# Patient Record
Sex: Male | Born: 1965 | Race: Black or African American | Hispanic: No | Marital: Single | Smoking: Never smoker
Health system: Southern US, Community
[De-identification: ages and names within clinical notes are randomized; demographics above are authoritative.]

## PROBLEM LIST (undated history)

## (undated) DIAGNOSIS — F1010 Alcohol abuse, uncomplicated: Secondary | ICD-10-CM

## (undated) HISTORY — DX: Alcohol abuse, uncomplicated: F10.10

---

## 2005-02-26 ENCOUNTER — Emergency Department: Payer: Self-pay | Admitting: Emergency Medicine

## 2019-01-24 ENCOUNTER — Emergency Department: Payer: No Typology Code available for payment source

## 2019-01-24 ENCOUNTER — Encounter: Payer: Self-pay | Admitting: Emergency Medicine

## 2019-01-24 ENCOUNTER — Other Ambulatory Visit: Payer: Self-pay

## 2019-01-24 ENCOUNTER — Emergency Department
Admission: EM | Admit: 2019-01-24 | Discharge: 2019-01-24 | Disposition: A | Discharge status: Home / Self Care | Payer: No Typology Code available for payment source | Attending: Emergency Medicine | Admitting: Emergency Medicine

## 2019-01-24 DIAGNOSIS — S86912A Strain of unspecified muscle(s) and tendon(s) at lower leg level, left leg, initial encounter: Secondary | ICD-10-CM

## 2019-01-24 DIAGNOSIS — Y999 Unspecified external cause status: Secondary | ICD-10-CM | POA: Insufficient documentation

## 2019-01-24 DIAGNOSIS — Y92414 Local residential or business street as the place of occurrence of the external cause: Secondary | ICD-10-CM | POA: Diagnosis not present

## 2019-01-24 DIAGNOSIS — Y93I9 Activity, other involving external motion: Secondary | ICD-10-CM | POA: Diagnosis not present

## 2019-01-24 DIAGNOSIS — S161XXA Strain of muscle, fascia and tendon at neck level, initial encounter: Secondary | ICD-10-CM | POA: Diagnosis not present

## 2019-01-24 DIAGNOSIS — S199XXA Unspecified injury of neck, initial encounter: Secondary | ICD-10-CM | POA: Diagnosis present

## 2019-01-24 DIAGNOSIS — S86812A Strain of other muscle(s) and tendon(s) at lower leg level, left leg, initial encounter: Secondary | ICD-10-CM | POA: Diagnosis not present

## 2019-01-24 MED ORDER — TRAMADOL HCL 50 MG PO TABS: 50.0000 mg | ORAL_TABLET | Freq: Four times a day (QID) | ORAL | 0 refills | Status: AC | PRN

## 2019-01-24 MED ORDER — METHOCARBAMOL 500 MG PO TABS: 500.0000 mg | ORAL_TABLET | Freq: Four times a day (QID) | ORAL | 1 refills | Status: AC

## 2019-01-24 MED ORDER — METHOCARBAMOL 500 MG PO TABS
500.0000 mg | ORAL_TABLET | Freq: Once | ORAL | Status: AC
  Administered 2019-01-24: 500 mg via ORAL
  Filled 2019-01-24: qty 1

## 2019-01-24 MED ORDER — TRAMADOL HCL 50 MG PO TABS
50.0000 mg | ORAL_TABLET | Freq: Once | ORAL | Status: AC
  Administered 2019-01-24: 50 mg via ORAL
  Filled 2019-01-24: qty 1
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
## 2019-01-24 NOTE — Discharge Instructions (Signed)
Please take medications as prescribed.  Apply ice to the neck and to the left leg 20 minutes every hour as needed for the next couple of days.  After 2 days you may transition over to heat.  Return to the ER for any worsening symptoms or changes in health.
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
## 2019-01-24 NOTE — ED Notes (Signed)
Esign pad not working. Pt received DC instructions and verbalized understanding;
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
## 2019-04-23 ENCOUNTER — Emergency Department: Payer: Self-pay

## 2019-04-23 ENCOUNTER — Emergency Department
Admission: EM | Admit: 2019-04-23 | Discharge: 2019-04-23 | Disposition: A | Discharge status: Home / Self Care | Payer: Self-pay | Attending: Student | Admitting: Student

## 2019-04-23 ENCOUNTER — Other Ambulatory Visit: Payer: Self-pay

## 2019-04-23 ENCOUNTER — Encounter: Payer: Self-pay | Admitting: *Deleted

## 2019-04-23 DIAGNOSIS — R079 Chest pain, unspecified: Secondary | ICD-10-CM | POA: Insufficient documentation

## 2019-04-23 LAB — BASIC METABOLIC PANEL
Anion gap: 10 (ref 5–15)
BUN: 8 mg/dL (ref 6–20)
CO2: 25 mmol/L (ref 22–32)
Calcium: 8.1 mg/dL — ABNORMAL LOW (ref 8.9–10.3)
Chloride: 100 mmol/L (ref 98–111)
Creatinine, Ser: 1.05 mg/dL (ref 0.61–1.24)
GFR calc Af Amer: >60 mL/min (ref >60)
GFR calc non Af Amer: >60 mL/min (ref >60)
Glucose, Bld: 112 mg/dL — ABNORMAL HIGH (ref 70–99)
Potassium: 3.5 mmol/L (ref 3.5–5.1)
Sodium: 135 mmol/L (ref 135–145)

## 2019-04-23 LAB — CBC
HCT: 47.1 % (ref 39.0–52.0)
Hemoglobin: 16.4 g/dL (ref 13.0–17.0)
MCH: 29.9 pg (ref 26.0–34.0)
MCHC: 34.8 g/dL (ref 30.0–36.0)
MCV: 85.8 fL (ref 80.0–100.0)
Platelets: 215 10*3/uL (ref 150–400)
RBC: 5.49 MIL/uL (ref 4.22–5.81)
RDW: 14.5 % (ref 11.5–15.5)
WBC: 5.5 10*3/uL (ref 4.0–10.5)
nRBC: 0 % (ref 0.0–0.2)

## 2019-04-23 LAB — TROPONIN I (HIGH SENSITIVITY)
Troponin I (High Sensitivity): 8 ng/L (ref <18)
Troponin I (High Sensitivity): 7 ng/L (ref <18)

## 2019-04-23 MED ORDER — ASPIRIN 81 MG PO CHEW
324.0000 mg | CHEWABLE_TABLET | Freq: Once | ORAL | Status: AC
  Administered 2019-04-23: 324 mg via ORAL
  Filled 2019-04-23: qty 4

## 2019-04-23 MED ORDER — FENTANYL CITRATE (PF) 100 MCG/2ML IJ SOLN
50.0000 ug | Freq: Once | INTRAMUSCULAR | Status: AC
  Administered 2019-04-23: 23:00:00 50 ug via INTRAVENOUS
  Filled 2019-04-23: qty 2

## 2019-04-23 NOTE — ED Notes (Signed)
Patient resting quietly in room with no apparent acute distress.  He is requesting pain medication for chest pain 7/10.  He is expressing no other needs at this time.  Toileting offered, bed in lowest position, call light within reach.  Will continue to monitor.

## 2019-04-23 NOTE — ED Provider Notes (Signed)
[facility] Emergency Department Provider Note  ____________________________________________   First MD Initiated Contact with Patient 04/23/19 2121     (approximate)  I have reviewed the triage vital signs and the nursing notes.  History  Chief Complaint Chest Pain    HPI Zachary Kemp is a 54 y.o. male with no significant medical history who presents emergency department for chest discomfort.  Patient reports central chest discomfort, radiating to his left arm.  Symptoms have been constant for 2 days.  He describes it as an aching type sensation, 7/10 in severity.  No alleviating or aggravating components.  No associated nausea, diaphoresis, shortness of breath.  Does not smoke.  No personal history of CAD.  He initially felt his symptoms were due to reflux, and has been taking over-the-counter GERD medications with no significant improvement.   Past Medical Hx No past medical history on file.  Problem List There are no problems to display for this patient.   Past Surgical Hx No past surgical history on file.  Medications Prior to Admission medications   Medication Sig Start Date End Date Taking? Authorizing Provider  methocarbamol (ROBAXIN) 500 MG tablet Take 1 tablet (500 mg total) by mouth 4 (four) times daily. 01/24/19   Evon Slack, PA-C  traMADol (ULTRAM) 50 MG tablet Take 1 tablet (50 mg total) by mouth every 6 (six) hours as needed. 01/24/19 01/24/20  Evon Slack, PA-C    Allergies Patient has no known allergies.  Family Hx No family history on file.  Social Hx Social History   Tobacco Use  . Smoking status: Never Smoker  . Smokeless tobacco: Never Used  Substance Use Topics  . Alcohol use: Yes  . Drug use: Never     Review of Systems  Constitutional: Negative for fever, chills. Eyes: Negative for visual changes. ENT: Negative for sore throat. Cardiovascular: + for chest pain. Respiratory: Negative for  shortness of breath. Gastrointestinal: Negative for nausea, vomiting.  Genitourinary: Negative for dysuria. Musculoskeletal: Negative for leg swelling. Skin: Negative for rash. Neurological: Negative for for headaches.   Physical Exam  Vital Signs: ED Triage Vitals  Enc Vitals Group     BP 04/23/19 2117 (!) 143/89     Pulse Rate 04/23/19 1945 83     Resp 04/23/19 1945 18     Temp 04/23/19 1945 99.7 F (37.6 C)     Temp Source 04/23/19 1945 Oral     SpO2 04/23/19 1945 95 %     Weight 04/23/19 1943 161 lb (73 kg)     Height 04/23/19 1943 5\' 4"  (1.626 m)     Head Circumference --      Peak Flow --      Pain Score 04/23/19 1942 7     Pain Loc --      Pain Edu? --      Excl. in GC? --     Constitutional: Alert and oriented.  Head: Normocephalic. Atraumatic. Eyes: Conjunctivae clear. Sclera anicteric. Nose: No congestion. No rhinorrhea. Mouth/Throat: Wearing mask.  Neck: No stridor.   Cardiovascular: Normal rate, regular rhythm. Extremities well perfused. Respiratory: Normal respiratory effort.  Lungs CTAB. Gastrointestinal: Soft. Non-tender. Non-distended.  Musculoskeletal: No lower extremity edema. No deformities. Neurologic:  Normal speech and language. No gross focal neurologic deficits are appreciated.  Skin: Skin is warm, dry and intact. No rash noted. Psychiatric: Mood and affect are appropriate for situation.  EKG  Personally reviewed.   Rate: 85 Rhythm: sinus  Axis: normal Intervals: WNL No acute ischemic changes No STEMI    Radiology  CXR: IMPRESSION:  No active cardiopulmonary disease.    Procedures  Procedure(s) performed (including critical care):  Procedures   Initial Impression / Assessment and Plan / ED Course  54 y.o. male who presents to the ED for chest discomfort, as above.  Ddx: ACS, GERD, MSK  Will obtain labs, EKG, imaging, ASA, pain control and reassess  EKG without acute ischemic changes, troponin x 2 negative, XR  negative.  Patient reports improvement in pain.  Given negative work-up, patient stable for discharge.  Given information for cardiology for outpatient follow-up.  Patient voices understanding and is comfortable to plan and discharge.   Final Clinical Impression(s) / ED Diagnosis  Final diagnoses:  Chest pain in adult       Note:  This document was prepared using Dragon voice recognition software and may include unintentional dictation errors.   Lilia Pro., MD 04/23/19 [phone]

## 2019-04-23 NOTE — ED Triage Notes (Signed)
Pt reports chest discomfort for 2 days.  No sob.  Nonsmoker.  Pt reports radiating pain in left arm   No n/v/  No diaphoresis.  Pt alert  Speech clear.

## 2019-04-23 NOTE — Discharge Instructions (Signed)
Thank you for letting us take care of you in the emergency department today.   Please continue to take any regular, prescribed medications.   Please follow up with: - A cardiology doctor to follow up on your ER visit, information for two are below  Please return to the ER for any new or worsening symptoms.

## 2019-08-04 ENCOUNTER — Ambulatory Visit: Payer: Self-pay | Attending: Internal Medicine

## 2019-08-04 DIAGNOSIS — Z23 Encounter for immunization: Secondary | ICD-10-CM

## 2019-08-04 NOTE — Progress Notes (Signed)
   Covid-19 Vaccination Clinic  Name:  Zachary Kemp    MRN: [id number] DOB: Sep 01, 1965  08/04/2019  [name] was observed post Covid-19 immunization for 15 minutes without incident. He was provided with Vaccine Information Sheet and instruction to access the V-Safe system.   Mr. Rash was instructed to call 911 with any severe reactions post vaccine: Marland Kitchen Difficulty breathing  . Swelling of face and throat  . A fast heartbeat  . A bad rash all over body  . Dizziness and weakness   Immunizations Administered    Name Date Dose VIS Date Route   Pfizer COVID-19 Vaccine 08/04/2019 10:12 AM 0.3 mL 06/11/2018 Intramuscular   Manufacturer: [employer], [employer]   Lot: [id number]   NDC: [address]-3

## 2019-08-26 ENCOUNTER — Ambulatory Visit: Payer: Self-pay | Attending: Internal Medicine

## 2019-08-26 DIAGNOSIS — Z23 Encounter for immunization: Secondary | ICD-10-CM

## 2019-08-26 NOTE — Progress Notes (Signed)
   Covid-19 Vaccination Clinic  Name:  Zachary Kemp    MRN: [id number] DOB: [birth year]  08/26/2019  Mr. Zachary Kemp was observed post Covid-19 immunization for 15 minutes without incident. He was provided with Vaccine Information Sheet and instruction to access the V-Safe system.   Mr. Zachary Kemp was instructed to call 911 with any severe reactions post vaccine: Marland Kitchen Difficulty breathing  . Swelling of face and throat  . A fast heartbeat  . A bad rash all over body  . Dizziness and weakness   Immunizations Administered    Name Date Dose VIS Date Route   Pfizer COVID-19 Vaccine 08/26/2019 10:16 AM 0.3 mL 06/11/2018 Intramuscular   Manufacturer: [employer], [employer]   Lot: [id number]   NDC: [address]-8

## 2020-09-03 ENCOUNTER — Other Ambulatory Visit: Payer: Self-pay

## 2020-09-03 ENCOUNTER — Ambulatory Visit: Payer: Self-pay | Admitting: Physician Assistant

## 2020-09-03 ENCOUNTER — Encounter: Payer: Self-pay | Admitting: Physician Assistant

## 2020-09-03 DIAGNOSIS — Z113 Encounter for screening for infections with a predominantly sexual mode of transmission: Secondary | ICD-10-CM

## 2020-09-03 DIAGNOSIS — A5401 Gonococcal cystitis and urethritis, unspecified: Secondary | ICD-10-CM

## 2020-09-03 LAB — GRAM STAIN

## 2020-09-03 MED ORDER — DOXYCYCLINE HYCLATE 100 MG PO TABS: 100.0000 mg | ORAL_TABLET | Freq: Two times a day (BID) | ORAL | 0 refills | Status: AC

## 2020-09-03 MED ORDER — CEFTRIAXONE SODIUM 500 MG IJ SOLR
500.0000 mg | Freq: Once | INTRAMUSCULAR | Status: AC
  Administered 2020-09-03: 500 mg via INTRAMUSCULAR

## 2020-09-03 NOTE — Progress Notes (Signed)
Pt here for STD screening.  Gram stain results reviewed.  Medication dispensed and Ceftriaxone 500 mg IM given without any complications.

## 2020-09-03 NOTE — Progress Notes (Signed)
[facility] Department STI clinic/screening visit  Subjective:  Zachary Kemp is a 55 y.o. male being seen today for an STI screening visit. The patient reports they do have symptoms.    Patient has the following medical conditions:  There are no problems to display for this patient.    Chief Complaint  Patient presents with  . SEXUALLY TRANSMITTED DISEASE    screening    HPI  Patient reports that he has had a creamy discharge with dysuria for 2 days.  Denies other symptoms, chronic conditions, surgeries and regular medicines.  States last HIV test was about 2 years ago and last void prior to sample collection for Gram stain was 30 minutes ago.   See flowsheet for further details and programmatic requirements.    The following portions of the patient's history were reviewed and updated as appropriate: allergies, current medications, past medical history, past social history, past surgical history and problem list.  Objective:  There were no vitals filed for this visit.  Physical Exam Constitutional:      General: He is not in acute distress.    Appearance: Normal appearance.  HENT:     Head: Normocephalic and atraumatic.     Comments: No nits,lice, or hair loss. No cervical, supraclavicular or axillary adenopathy.    Mouth/Throat:     Mouth: Mucous membranes are moist.     Pharynx: Oropharynx is clear. No oropharyngeal exudate or posterior oropharyngeal erythema.  Eyes:     Conjunctiva/sclera: Conjunctivae normal.  Pulmonary:     Effort: Pulmonary effort is normal.  Abdominal:     Palpations: Abdomen is soft. There is no mass.     Tenderness: There is no abdominal tenderness. There is no guarding or rebound.  Genitourinary:    Penis: Normal.      Testes: Normal.     Comments: Pubic area without nits, lice, hair loss, edema, erythema, lesions and inguinal adenopathy. Penis circumcised without rash or lesions.  Moderate amount of brownish discharge  at meatus. Testicles descended bilaterally,nt, no masses or edema. Musculoskeletal:     Cervical back: Neck supple. No tenderness.  Skin:    General: Skin is warm and dry.     Findings: No bruising, erythema, lesion or rash.  Neurological:     Mental Status: He is alert and oriented to person, place, and time.  Psychiatric:        Mood and Affect: Mood normal.        Behavior: Behavior normal.        Thought Content: Thought content normal.        Judgment: Judgment normal.       Assessment and Plan:  Zachary Kemp is a 55 y.o. male presenting to the [facility] Department for STI screening  1. Screening for STD (sexually transmitted disease) Patient into clinic with symptoms. Rec condoms with all sex. Await test results.  Counseled that RN will call if needs to RTC for treatment once results are back. - Gram stain - HBV Antigen/Antibody [facility] Lab - HIV/HCV [place] [facility] - Syphilis Serology,  Lab  2. Gonococcal urethritis in male Treat for GC and cover for Chlamydia with Ceftriaxone 500 mg IM and Doxycycline 100 mg #14 1 po BID for 7 days. No sex for 14 days and until after partner completes treatment. Call with questions or concerns. - cefTRIAXone (ROCEPHIN) injection 500 mg - doxycycline (VIBRA-TABS) 100 MG tablet; Take 1 tablet (100 mg total)  by mouth 2 (two) times daily for 7 days.  Dispense: 14 tablet; Refill: 0     No follow-ups on file.  No future appointments.  Matt Holmes, PA

## 2020-09-08 LAB — HM HEPATITIS C SCREENING LAB: HM Hepatitis Screen: NEGATIVE

## 2020-09-08 LAB — HM HIV SCREENING LAB: HM HIV Screening: NEGATIVE

## 2020-09-10 LAB — HEPATITIS B SURFACE ANTIGEN: Hepatitis B Surface Ag: NONREACTIVE

## 2021-05-02 IMAGING — CR DG CHEST 2V
1 series · 2 of 2 positions shown · non-contrast
Comparison: 09/26/2014

CLINICAL DATA: Chest pain for 2 days

EXAM:
CHEST - 2 VIEW

[Series 1: dg chest 2 view · 0.14mm/px · 2 of 2 slices shown]
[im 1/2]
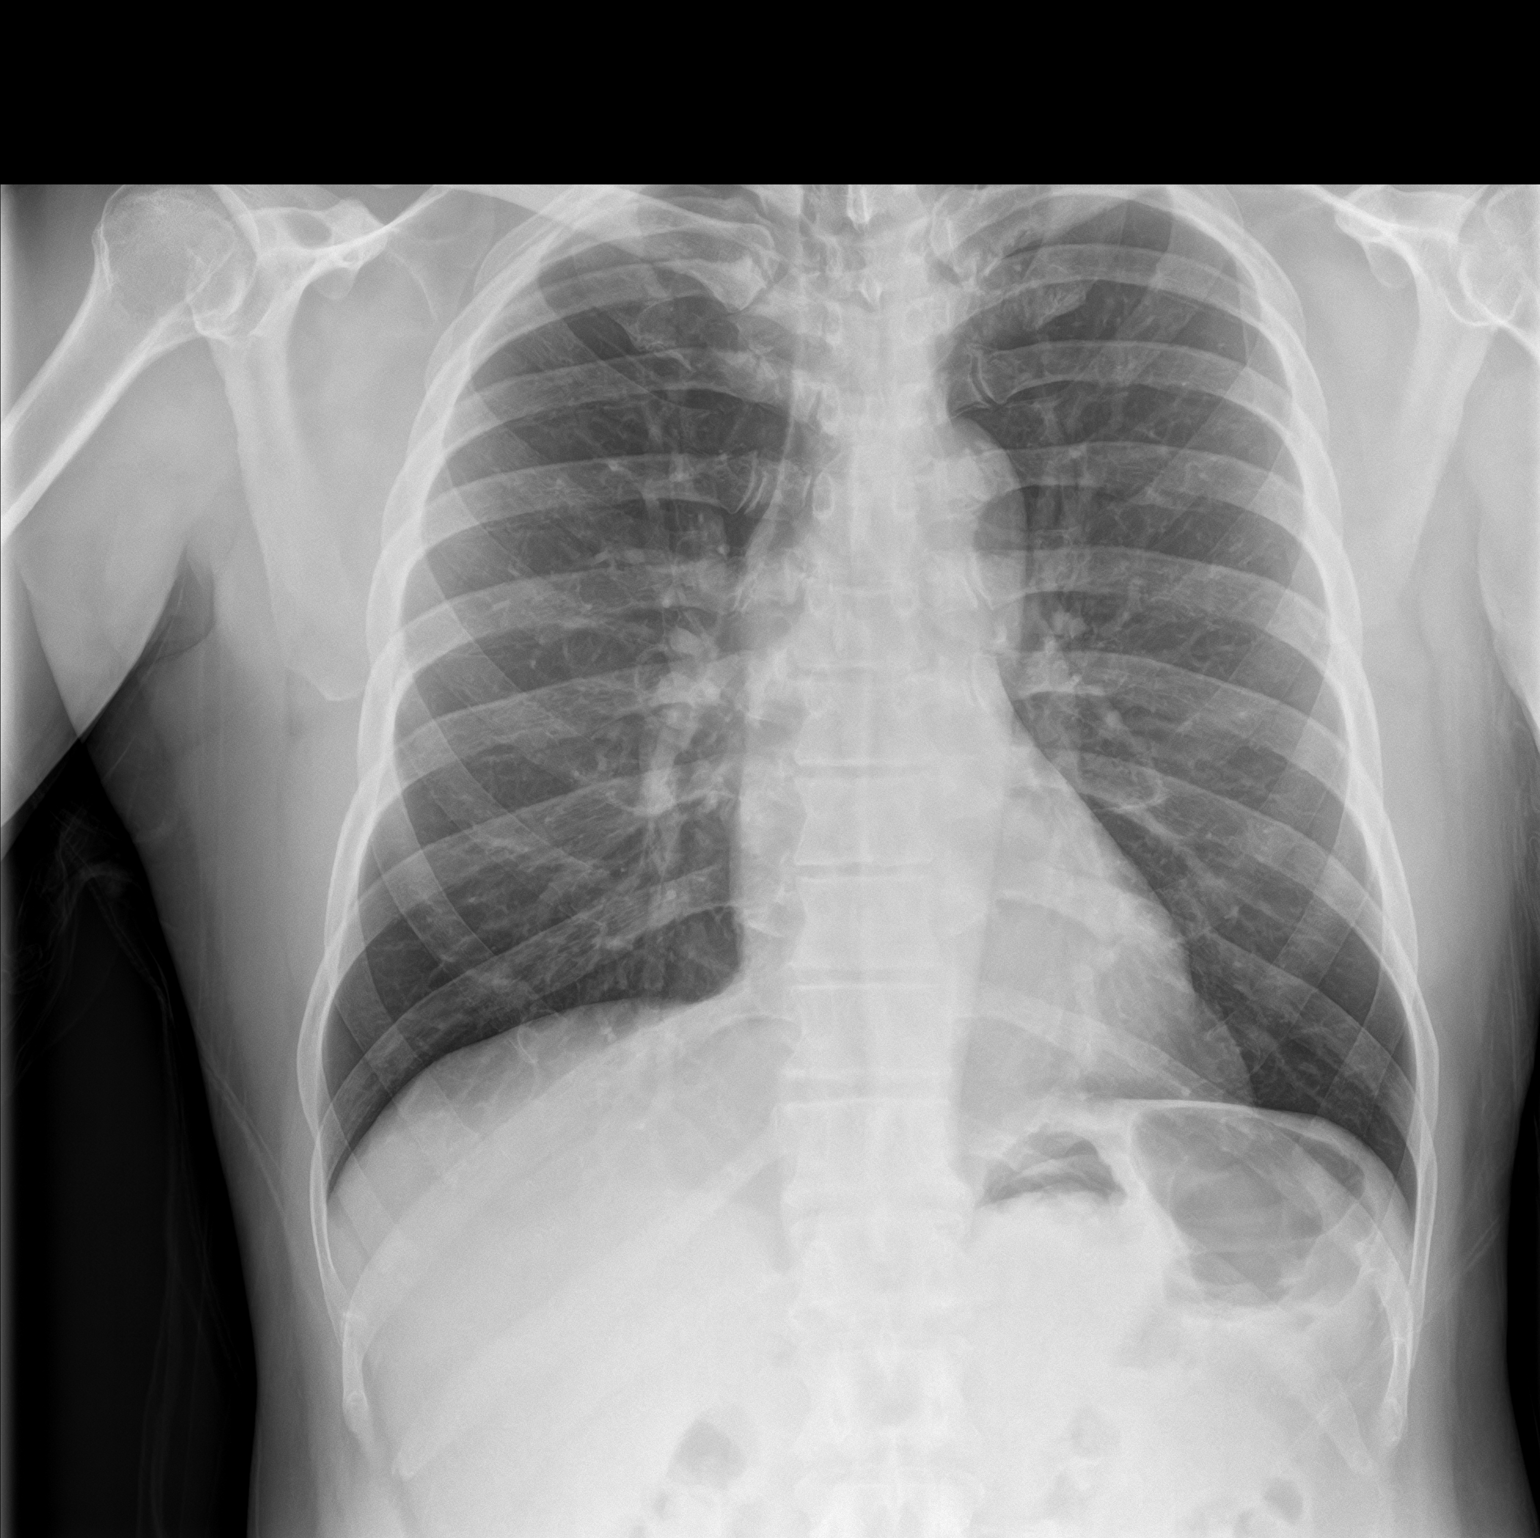
[im 2/2]
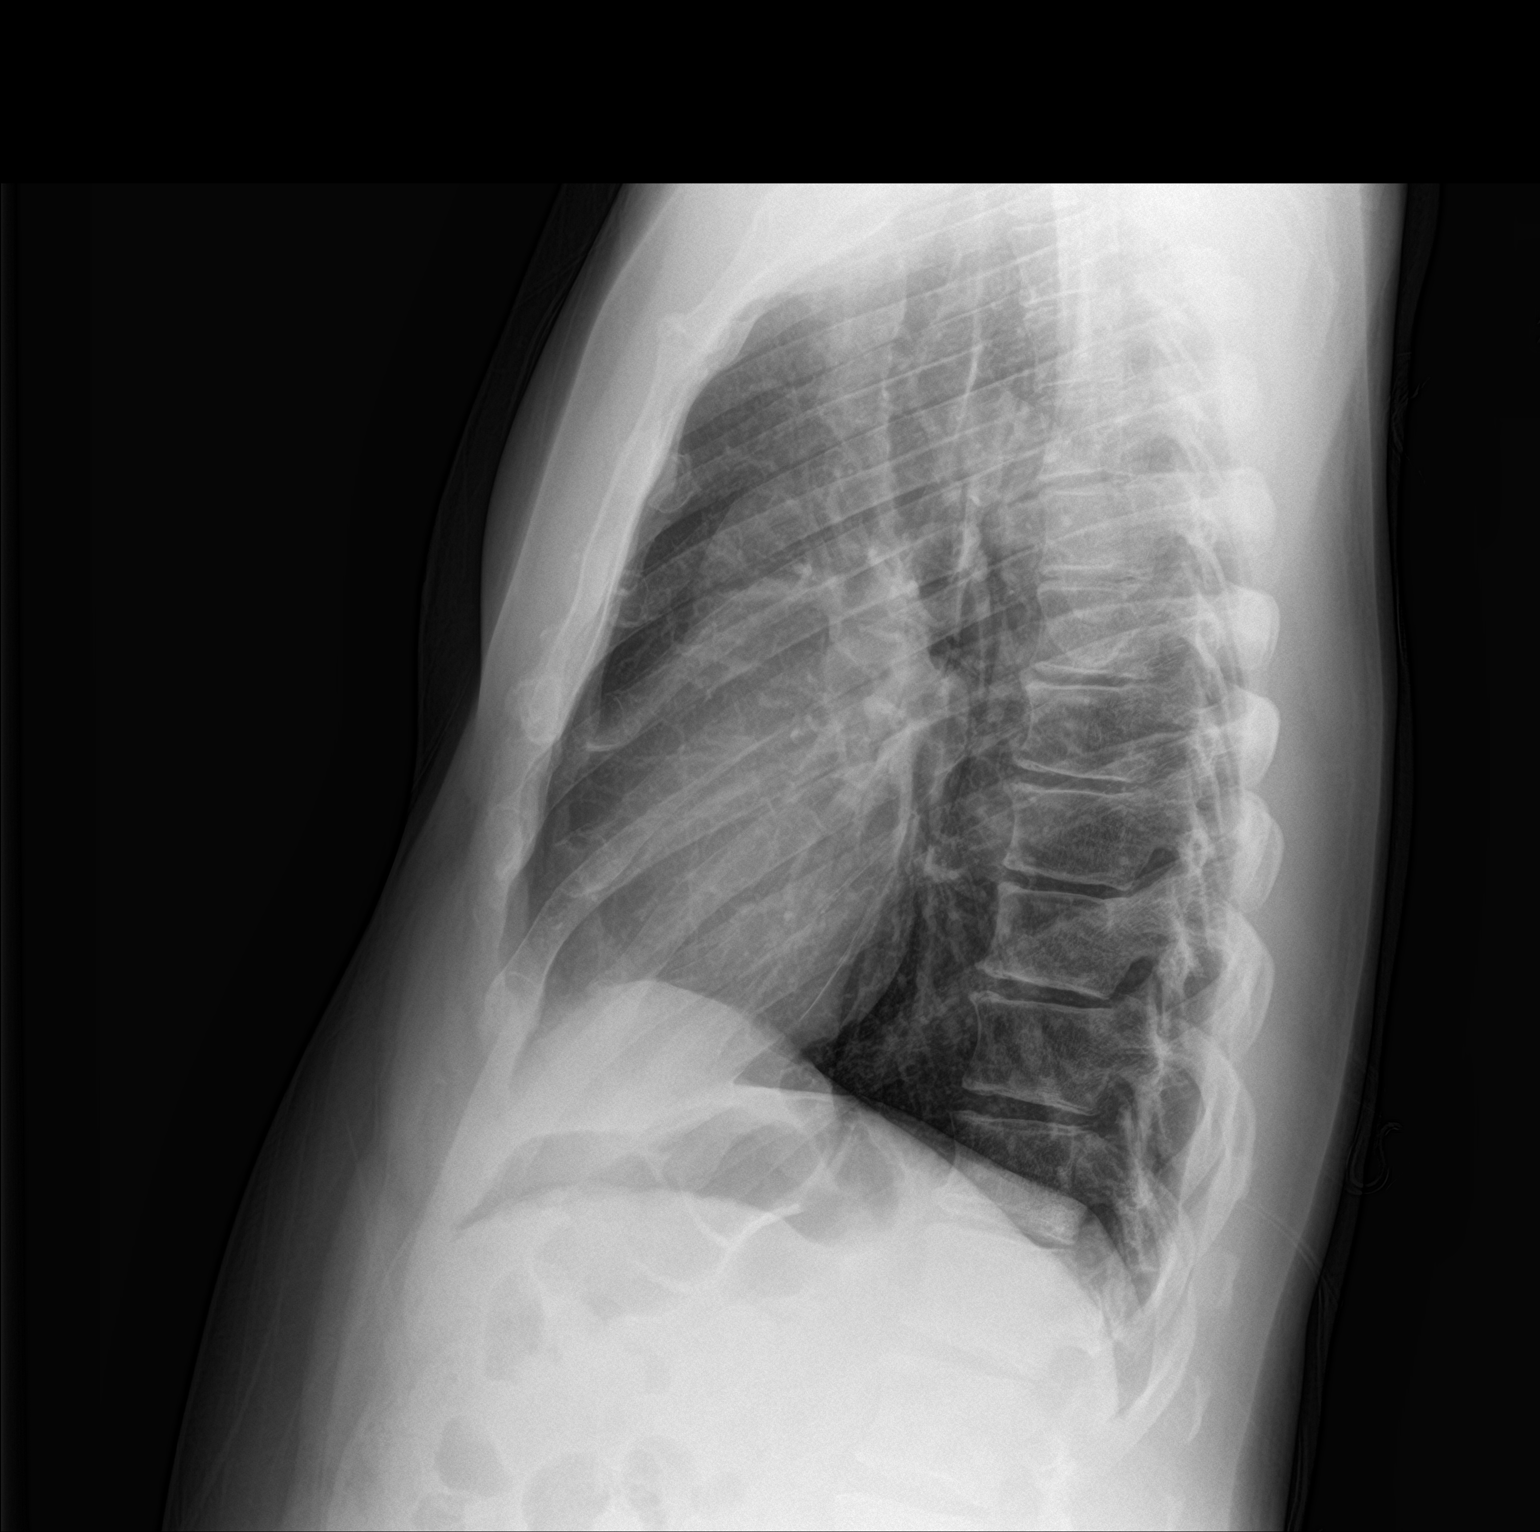

[2 of 2 positions shown; findings below may reference images not displayed]

FINDINGS: Cardiac shadows within normal limits. The lungs are well aerated
bilaterally. No focal infiltrate or sizable effusion is seen. No
bony abnormality is noted.
IMPRESSION: No active cardiopulmonary disease.

## 2021-10-05 NOTE — Congregational Nurse Program (Signed)
  Dept: [phone]   Congregational Nurse Program Note  Date of Encounter: 10/05/2021  Past Medical History: No past medical history on file.  Encounter Details:  CNP Questionnaire - 10/05/21 1315       Questionnaire   Do you give verbal consent to treat you today? Yes    Location Patient Served  Not Applicable    Visit Setting Church or Organization    Patient Status Unknown    Insurance Uninsured ([employer]/Self-Pay)    Production designer, theatre/television/film Assistance    Medication N/A    Medical Provider No    Screening Referrals Annual Wellness Visit    Medical Referral [facility] PCP/Clinic;Dental    Medical Appointment Made N/A    Food Have Food Insecurities    Transportation Need transportation assistance    Housing/Utilities No permanent housing    Interpersonal Safety N/A    Intervention Navigate Healthcare System;Counsel    ED Visit Averted N/A    Life-Saving Intervention Made N/A            client into nurse only clinic at [facility] requesting assistance with access to PCP and dentist. Initial Visit. HIPAA discussed. Co re [facility] and [facility] dental [facility]; completed and submitted application. Co re importance of routine medical care. Declines screenings today. States no medical care in 4 years. Non smoker, drinks a 6-pack / day. Does not take any meds. States he stays with mom for now; but will need housing in ~2 weeks. No income. His mom provides essentials. Best phone number is mother's at [phone]. Okay to leave a message with his mom. follow up with [facility]. RTC prn. Rhermann, RN

## 2022-08-26 ENCOUNTER — Emergency Department
Admission: EM | Admit: 2022-08-26 | Discharge: 2022-08-26 | Disposition: A | Discharge status: Home / Self Care | Payer: 59 | Attending: Emergency Medicine | Admitting: Emergency Medicine

## 2022-08-26 ENCOUNTER — Other Ambulatory Visit: Payer: Self-pay

## 2022-08-26 DIAGNOSIS — K2971 Gastritis, unspecified, with bleeding: Secondary | ICD-10-CM | POA: Diagnosis not present

## 2022-08-26 DIAGNOSIS — K2970 Gastritis, unspecified, without bleeding: Secondary | ICD-10-CM

## 2022-08-26 DIAGNOSIS — R231 Pallor: Secondary | ICD-10-CM | POA: Diagnosis not present

## 2022-08-26 DIAGNOSIS — R1030 Lower abdominal pain, unspecified: Secondary | ICD-10-CM | POA: Diagnosis not present

## 2022-08-26 DIAGNOSIS — I959 Hypotension, unspecified: Secondary | ICD-10-CM | POA: Diagnosis not present

## 2022-08-26 DIAGNOSIS — R109 Unspecified abdominal pain: Secondary | ICD-10-CM | POA: Diagnosis not present

## 2022-08-26 LAB — CBC
HCT: 48.8 % (ref 39.0–52.0)
Hemoglobin: 16.3 g/dL (ref 13.0–17.0)
MCH: 31 pg (ref 26.0–34.0)
MCHC: 33.4 g/dL (ref 30.0–36.0)
MCV: 92.8 fL (ref 80.0–100.0)
Platelets: 154 10*3/uL (ref 150–400)
RBC: 5.26 MIL/uL (ref 4.22–5.81)
RDW: 13.9 % (ref 11.5–15.5)
WBC: 5.5 10*3/uL (ref 4.0–10.5)
nRBC: 0 % (ref 0.0–0.2)

## 2022-08-26 LAB — URINALYSIS, ROUTINE W REFLEX MICROSCOPIC
Bacteria, UA: NONE SEEN
Bilirubin Urine: NEGATIVE
Glucose, UA: NEGATIVE mg/dL
Hgb urine dipstick: NEGATIVE
Leukocytes,Ua: NEGATIVE
Nitrite: NEGATIVE
Protein, ur: NEGATIVE mg/dL
Specific Gravity, Urine: 1.015 (ref 1.005–1.030)
Squamous Epithelial / HPF: NONE SEEN /HPF (ref 0–5)
pH: 5.5 (ref 5.0–8.0)

## 2022-08-26 LAB — COMPREHENSIVE METABOLIC PANEL
ALT: 50 U/L — ABNORMAL HIGH (ref 0–44)
AST: 79 U/L — ABNORMAL HIGH (ref 15–41)
Albumin: 4.1 g/dL (ref 3.5–5.0)
Alkaline Phosphatase: 26 U/L — ABNORMAL LOW (ref 38–126)
Anion gap: 5 (ref 5–15)
BUN: 7 mg/dL (ref 6–20)
CO2: 25 mmol/L (ref 22–32)
Calcium: 7.7 mg/dL — ABNORMAL LOW (ref 8.9–10.3)
Chloride: 103 mmol/L (ref 98–111)
Creatinine, Ser: 0.9 mg/dL (ref 0.61–1.24)
GFR, Estimated: >60 mL/min (ref >60)
Glucose, Bld: 123 mg/dL — ABNORMAL HIGH (ref 70–99)
Potassium: 4.2 mmol/L (ref 3.5–5.1)
Sodium: 133 mmol/L — ABNORMAL LOW (ref 135–145)
Total Bilirubin: 0.9 mg/dL (ref 0.3–1.2)
Total Protein: 6.3 g/dL — ABNORMAL LOW (ref 6.5–8.1)

## 2022-08-26 LAB — LIPASE, BLOOD: Lipase: 41 U/L (ref 11–51)

## 2022-08-26 MED ORDER — ALUM & MAG HYDROXIDE-SIMETH 200-200-20 MG/5ML PO SUSP
30.0000 mL | Freq: Once | ORAL | Status: AC
  Administered 2022-08-26: 30 mL via ORAL
  Filled 2022-08-26: qty 30

## 2022-08-26 MED ORDER — FAMOTIDINE 20 MG PO TABS: 20.0000 mg | ORAL_TABLET | Freq: Every day | ORAL | 1 refills | Status: AC

## 2022-08-26 MED ORDER — SUCRALFATE 1 G PO TABS: 1.0000 g | ORAL_TABLET | Freq: Four times a day (QID) | ORAL | 0 refills | Status: AC

## 2022-08-26 MED ORDER — LIDOCAINE VISCOUS HCL 2 % MT SOLN
15.0000 mL | Freq: Once | OROMUCOSAL | Status: AC
  Administered 2022-08-26: 15 mL via ORAL
  Filled 2022-08-26: qty 15

## 2022-08-26 NOTE — Discharge Instructions (Signed)
Please seek medical attention for any high fevers, chest pain, shortness of breath, change in behavior, persistent vomiting, bloody stool or any other new or concerning symptoms.  

## 2022-08-26 NOTE — ED Provider Notes (Signed)
[facility] Provider Note    Event Date/Time   First MD Initiated Contact with Patient 08/26/22 2137     (approximate)   History   Abdominal Pain   HPI  Zachary Kemp is a 57 y.o. male  who presents to the emergency department today because of concern for abdominal pain. The patient states that the pain started today after he drank a beer. Located in the periumbilical region. Initially 10/10 however at the time of my exam 5/10. The patient did not have any nausea or vomiting. Denies any recent change in stool. Does use alcohol daily. Patient also gave plasma prior to imbibing but has done that for a long time and has not had the abdominal pain before.      Physical Exam   Triage Vital Signs: ED Triage Vitals  Enc Vitals Group     BP 08/26/22 1709 (!) 135/90     Pulse Rate 08/26/22 1709 72     Resp 08/26/22 1709 17     Temp 08/26/22 1709 98.3 F (36.8 C)     Temp Source 08/26/22 1709 Oral     SpO2 08/26/22 1709 95 %     Weight 08/26/22 1715 157 lb (71.2 kg)     Height 08/26/22 1715 5\' 4"  (1.626 m)     Head Circumference --      Peak Flow --      Pain Score 08/26/22 1714 5     Pain Loc --      Pain Edu? --      Excl. in GC? --     Most recent vital signs: Vitals:   08/26/22 1709  BP: (!) 135/90  Pulse: 72  Resp: 17  Temp: 98.3 F (36.8 C)  SpO2: 95%   General: Awake, alert, oriented. CV:  Good peripheral perfusion. Regular rate and rhythm. Resp:  Normal effort. Lungs clear. Abd:  No distention. Tender to palpation in the epigastric region.   ED Results / Procedures / Treatments   Labs (all labs ordered are listed, but only abnormal results are displayed) Labs Reviewed  URINALYSIS, ROUTINE W REFLEX MICROSCOPIC - Abnormal; Notable for the following components:      Result Value   Color, Urine YELLOW (*)    Ketones, ur TRACE (*)    All other components within normal limits  COMPREHENSIVE METABOLIC PANEL - Abnormal; Notable  for the following components:   Sodium 133 (*)    Glucose, Bld 123 (*)    Calcium 7.7 (*)    Total Protein 6.3 (*)    AST 79 (*)    ALT 50 (*)    Alkaline Phosphatase 26 (*)    All other components within normal limits  CBC  LIPASE, BLOOD     EKG  None   RADIOLOGY None   PROCEDURES:  Critical Care performed: No    MEDICATIONS ORDERED IN ED: Medications - No data to display   IMPRESSION / MDM / ASSESSMENT AND PLAN / ED COURSE  I reviewed the triage vital signs and the nursing notes.                              Differential diagnosis includes, but is not limited to, pancreatitis, gastritis, ulcer, liver disease, cholecystitis.  Patient's presentation is most consistent with acute presentation with potential threat to life or bodily function.  Patient presented to the emergency department today because of  concerns for abdominal pain.  He indicates the periumbilical region.  On exam he is tender more in the epigastric region.  Patient was afebrile.  Blood work without concerning leukocytosis, elevation of lipase or LFTs.  Patient does admit to heavy alcohol use.  At this time I think a call gastritis likely.  Patient was given GI cocktail and stated that he did feel improvement.  Will plan on discharging with antacid and sucralfate. Will give dietary guidelines.   FINAL CLINICAL IMPRESSION(S) / ED DIAGNOSES   Final diagnoses:  Gastritis, presence of bleeding unspecified, unspecified chronicity, unspecified gastritis type      Note:  This document was prepared using Dragon voice recognition software and may include unintentional dictation errors.    Phineas Semen, MD 08/27/22 [phone]

## 2022-08-26 NOTE — ED Triage Notes (Signed)
First Nurse Note:  [place] Pt gave plasma approx 1 hour, pt c/o LLQ abd pain, tender on palpation. Pt is A&Ox4 and NAD 20G L forearm  EMS reports 90/48 initially, 150/84 post fluid bolus.  12 lead EKG unremarkable per EMS  60 HR  132 CBG

## 2022-08-26 NOTE — ED Notes (Signed)
Pt states abd pain didn't started until after drinking a beer [place]

## 2022-08-26 NOTE — ED Provider Triage Note (Signed)
Emergency Medicine Provider Triage Evaluation Note  Zachary Kemp , a 57 y.o. male  was evaluated in triage.  Pt complains of abdominal pain. He gave plasma earlier today, then drank a beer later on. He developed abdominal cramping and pain. No vomiting or diarrhea.  Physical Exam  BP (!) 135/90 (BP Location: Right Arm)   Pulse 72   Temp 98.3 F (36.8 C) (Oral)   Resp 17   Ht 5\' 4"  (1.626 m)   Wt 71.2 kg   SpO2 95%   BMI 26.95 kg/m  Gen:   Awake, no distress   Resp:  Normal effort  MSK:   Moves extremities without difficulty  Other:    Medical Decision Making  Medically screening exam initiated at 5:22 PM.  Appropriate orders placed.  Zachary Kemp was informed that the remainder of the evaluation will be completed by another provider, this initial triage assessment does not replace that evaluation, and the importance of remaining in the ED until their evaluation is complete.   Chinita Pester, FNP 08/26/22 1724

## 2022-10-01 ENCOUNTER — Emergency Department (HOSPITAL_COMMUNITY)
Admission: EM | Admit: 2022-10-01 | Discharge: 2022-10-01 | Disposition: A | Discharge status: Home / Self Care | Payer: 59 | Attending: Emergency Medicine | Admitting: Emergency Medicine

## 2022-10-01 ENCOUNTER — Other Ambulatory Visit: Payer: Self-pay

## 2022-10-01 ENCOUNTER — Encounter (HOSPITAL_COMMUNITY): Payer: Self-pay

## 2022-10-01 DIAGNOSIS — Y906 Blood alcohol level of 120-199 mg/100 ml: Secondary | ICD-10-CM | POA: Diagnosis not present

## 2022-10-01 DIAGNOSIS — R5383 Other fatigue: Secondary | ICD-10-CM | POA: Insufficient documentation

## 2022-10-01 DIAGNOSIS — R7989 Other specified abnormal findings of blood chemistry: Secondary | ICD-10-CM | POA: Insufficient documentation

## 2022-10-01 DIAGNOSIS — F1090 Alcohol use, unspecified, uncomplicated: Secondary | ICD-10-CM | POA: Insufficient documentation

## 2022-10-01 DIAGNOSIS — R5381 Other malaise: Secondary | ICD-10-CM | POA: Diagnosis not present

## 2022-10-01 DIAGNOSIS — D72819 Decreased white blood cell count, unspecified: Secondary | ICD-10-CM | POA: Insufficient documentation

## 2022-10-01 DIAGNOSIS — R531 Weakness: Secondary | ICD-10-CM | POA: Diagnosis not present

## 2022-10-01 DIAGNOSIS — R110 Nausea: Secondary | ICD-10-CM | POA: Diagnosis not present

## 2022-10-01 LAB — CBC WITH DIFFERENTIAL/PLATELET
Abs Immature Granulocytes: 0.01 10*3/uL (ref 0.00–0.07)
Basophils Absolute: 0 10*3/uL (ref 0.0–0.1)
Basophils Relative: 1 %
Eosinophils Absolute: 0.1 10*3/uL (ref 0.0–0.5)
Eosinophils Relative: 3 %
HCT: 43.6 % (ref 39.0–52.0)
Hemoglobin: 15 g/dL (ref 13.0–17.0)
Immature Granulocytes: 0 %
Lymphocytes Relative: 21 %
Lymphs Abs: 0.6 10*3/uL — ABNORMAL LOW (ref 0.7–4.0)
MCH: 31.3 pg (ref 26.0–34.0)
MCHC: 34.4 g/dL (ref 30.0–36.0)
MCV: 91 fL (ref 80.0–100.0)
Monocytes Absolute: 0.4 10*3/uL (ref 0.1–1.0)
Monocytes Relative: 12 %
Neutro Abs: 2 10*3/uL (ref 1.7–7.7)
Neutrophils Relative %: 63 %
Platelets: 194 10*3/uL (ref 150–400)
RBC: 4.79 MIL/uL (ref 4.22–5.81)
RDW: 13.6 % (ref 11.5–15.5)
WBC: 3.1 10*3/uL — ABNORMAL LOW (ref 4.0–10.5)
nRBC: 0 % (ref 0.0–0.2)

## 2022-10-01 LAB — COMPREHENSIVE METABOLIC PANEL
ALT: 69 U/L — ABNORMAL HIGH (ref 0–44)
AST: 120 U/L — ABNORMAL HIGH (ref 15–41)
Albumin: 3.7 g/dL (ref 3.5–5.0)
Alkaline Phosphatase: 25 U/L — ABNORMAL LOW (ref 38–126)
Anion gap: 12 (ref 5–15)
BUN: <5 mg/dL — ABNORMAL LOW (ref 6–20)
CO2: 24 mmol/L (ref 22–32)
Calcium: 8.2 mg/dL — ABNORMAL LOW (ref 8.9–10.3)
Chloride: 99 mmol/L (ref 98–111)
Creatinine, Ser: 0.94 mg/dL (ref 0.61–1.24)
GFR, Estimated: >60 mL/min (ref >60)
Glucose, Bld: 106 mg/dL — ABNORMAL HIGH (ref 70–99)
Potassium: 3.5 mmol/L (ref 3.5–5.1)
Sodium: 135 mmol/L (ref 135–145)
Total Bilirubin: 0.7 mg/dL (ref 0.3–1.2)
Total Protein: 6 g/dL — ABNORMAL LOW (ref 6.5–8.1)

## 2022-10-01 LAB — ETHANOL: Alcohol, Ethyl (B): 141 mg/dL — ABNORMAL HIGH (ref <10)

## 2022-10-01 LAB — MAGNESIUM: Magnesium: 2.1 mg/dL (ref 1.7–2.4)

## 2022-10-01 MED ORDER — SODIUM CHLORIDE 0.9 % IV BOLUS
1000.0000 mL | Freq: Once | INTRAVENOUS | Status: AC
  Administered 2022-10-01: 1000 mL via INTRAVENOUS

## 2022-10-01 MED ORDER — THIAMINE MONONITRATE 100 MG PO TABS
100.0000 mg | ORAL_TABLET | Freq: Once | ORAL | Status: AC
  Administered 2022-10-01: 100 mg via ORAL
  Filled 2022-10-01: qty 1

## 2022-10-01 NOTE — ED Provider Notes (Signed)
[place] EMERGENCY DEPARTMENT AT [facility] Provider Note   CSN: [id number] Arrival date & time: 10/01/22  4098     History {Add pertinent medical, surgical, social history, OB history to HPI:1} Chief Complaint  Patient presents with   Weakness    Zachary Kemp is a 57 y.o. male.  57 year old male with history of alcohol abuse presents to the emergency department for evaluation of fatigue and malaise.  He states that his symptoms began on Wednesday while he was donating plasma.  During one of his final cycles of donation, he became lightheaded and diaphoretic with a near syncopal episode.  He reports that he has remained fatigue with episodes of lightheadedness since this time.  Lightheadedness can be aggravated by position change.  He denies taking any medications for his symptoms.  Triage note references nausea, but patient denies this.  Further denies fever, cough, congestion, chest pain, shortness of breath, abdominal pain, diarrhea, vomiting.  He does drink approximately 15 cans of beer a day, per spouse.  He does not get tremulous when he is not drinking and denies a history of alcohol withdrawal seizures.  As an aside, he states that he was bit by a tick on Monday on the right thigh.  This area is healing well.  The history is provided by the patient. No language interpreter was used.  Weakness      Home Medications Prior to Admission medications   Medication Sig Start Date End Date Taking? Authorizing Provider  famotidine (PEPCID) 20 MG tablet Take 1 tablet (20 mg total) by mouth daily. 08/26/22 08/26/23  Phineas Semen, MD  methocarbamol (ROBAXIN) 500 MG tablet Take 1 tablet (500 mg total) by mouth 4 (four) times daily. Patient not taking: Reported on 09/03/2020 01/24/19   Evon Slack, PA-C  sucralfate (CARAFATE) 1 g tablet Take 1 tablet (1 g total) by mouth 4 (four) times daily. 08/26/22   Phineas Semen, MD      Allergies    Patient has no known  allergies.    Review of Systems   Review of Systems  Neurological:  Positive for weakness.  Ten systems reviewed and are negative for acute change, except as noted in the HPI.    Physical Exam Updated Vital Signs BP 122/83   Pulse 72   Temp 98.2 F (36.8 C) (Oral)   Resp 15   Ht 5\' 4"  (1.626 m)   Wt 71.2 kg   SpO2 96%   BMI 26.95 kg/m   Physical Exam Vitals and nursing note reviewed.  Constitutional:      General: He is not in acute distress.    Appearance: He is well-developed. He is not diaphoretic.     Comments: Nontoxic appearing and in NAD  HENT:     Head: Normocephalic and atraumatic.  Eyes:     General: No scleral icterus.    Conjunctiva/sclera: Conjunctivae normal.  Cardiovascular:     Rate and Rhythm: Normal rate and regular rhythm.     Pulses: Normal pulses.  Pulmonary:     Effort: Pulmonary effort is normal. No respiratory distress.     Breath sounds: No stridor. No wheezing.     Comments: Respirations even and unlabored. Lungs CTAB. Abdominal:     Palpations: Abdomen is soft. There is no mass.     Tenderness: There is no abdominal tenderness. There is no guarding.     Comments: Soft, nondistended, nontender.  Musculoskeletal:        General:  Normal range of motion.     Cervical back: Normal range of motion.  Skin:    General: Skin is warm and dry.     Coloration: Skin is not pale.     Findings: No erythema or rash.  Neurological:     Mental Status: He is alert and oriented to person, place, and time.  Psychiatric:        Behavior: Behavior normal.     ED Results / Procedures / Treatments   Labs (all labs ordered are listed, but only abnormal results are displayed) Labs Reviewed  CBC WITH DIFFERENTIAL/PLATELET  COMPREHENSIVE METABOLIC PANEL  ETHANOL  MAGNESIUM    EKG None  Radiology No results found.  Procedures Procedures  {Document cardiac monitor, telemetry assessment procedure when appropriate:1}  Medications Ordered in  ED Medications  sodium chloride 0.9 % bolus 1,000 mL (has no administration in time range)  thiamine (VITAMIN B1) tablet 100 mg (has no administration in time range)    ED Course/ Medical Decision Making/ A&P   {   Click here for ABCD2, HEART and other calculatorsREFRESH Note before signing :1}                          Medical Decision Making Amount and/or Complexity of Data Reviewed Labs: ordered.  Risk OTC drugs.   ***  {Document critical care time when appropriate:1} {Document review of labs and clinical decision tools ie heart score, Chads2Vasc2 etc:1}  {Document your independent review of radiology images, and any outside records:1} {Document your discussion with family members, caretakers, and with consultants:1} {Document social determinants of health affecting pt's care:1} {Document your decision making why or why not admission, treatments were needed:1} Final Clinical Impression(s) / ED Diagnoses Final diagnoses:  None    Rx / DC Orders ED Discharge Orders     None

## 2022-10-01 NOTE — ED Triage Notes (Signed)
Weakness/malaise x 4 days. Nausea with no vomiting, dizziness with standing. Tick bite to right thigh 6 days ago. Plasma donation Wednesday, heavy ETOH use daily.

## 2022-10-01 NOTE — ED Notes (Signed)
Wife reports heavy alcohol use as does patient, though their amounts used daily differ. Patient cooperative during triage.

## 2022-10-01 NOTE — Discharge Instructions (Signed)
Your work up in the ED has been generally reassuring. We advise follow up with a primary care doctor for recheck. Limit your alcohol consumption and try to increase your daily water intake. Return for new or concerning symptoms.

## 2022-10-02 LAB — LYME DISEASE SEROLOGY W/REFLEX: Lyme Total Antibody EIA: NEGATIVE

## 2022-10-05 LAB — SPOTTED FEVER GROUP ANTIBODIES
Spotted Fever Group IgG: <1:64 {titer}
Spotted Fever Group IgM: <1:64 {titer}

## 2022-10-16 DIAGNOSIS — Z125 Encounter for screening for malignant neoplasm of prostate: Secondary | ICD-10-CM | POA: Diagnosis not present

## 2022-10-16 DIAGNOSIS — R0602 Shortness of breath: Secondary | ICD-10-CM | POA: Diagnosis not present

## 2022-10-16 DIAGNOSIS — Z1159 Encounter for screening for other viral diseases: Secondary | ICD-10-CM | POA: Diagnosis not present

## 2022-10-16 DIAGNOSIS — R5383 Other fatigue: Secondary | ICD-10-CM | POA: Diagnosis not present

## 2022-10-16 DIAGNOSIS — Z6825 Body mass index (BMI) 25.0-25.9, adult: Secondary | ICD-10-CM | POA: Diagnosis not present

## 2022-10-16 DIAGNOSIS — E559 Vitamin D deficiency, unspecified: Secondary | ICD-10-CM | POA: Diagnosis not present

## 2022-10-16 DIAGNOSIS — Z0000 Encounter for general adult medical examination without abnormal findings: Secondary | ICD-10-CM | POA: Diagnosis not present

## 2022-10-16 DIAGNOSIS — Z1211 Encounter for screening for malignant neoplasm of colon: Secondary | ICD-10-CM | POA: Diagnosis not present

## 2022-11-16 DIAGNOSIS — F1020 Alcohol dependence, uncomplicated: Secondary | ICD-10-CM | POA: Diagnosis not present

## 2022-11-16 DIAGNOSIS — F1999 Other psychoactive substance use, unspecified with unspecified psychoactive substance-induced disorder: Secondary | ICD-10-CM | POA: Diagnosis not present

## 2022-11-17 DIAGNOSIS — F1020 Alcohol dependence, uncomplicated: Secondary | ICD-10-CM | POA: Diagnosis not present

## 2022-11-18 DIAGNOSIS — F1020 Alcohol dependence, uncomplicated: Secondary | ICD-10-CM | POA: Diagnosis not present

## 2022-11-19 DIAGNOSIS — F1020 Alcohol dependence, uncomplicated: Secondary | ICD-10-CM | POA: Diagnosis not present

## 2022-11-20 DIAGNOSIS — F1020 Alcohol dependence, uncomplicated: Secondary | ICD-10-CM | POA: Diagnosis not present

## 2022-11-21 DIAGNOSIS — F1020 Alcohol dependence, uncomplicated: Secondary | ICD-10-CM | POA: Diagnosis not present

## 2022-11-24 DIAGNOSIS — F1020 Alcohol dependence, uncomplicated: Secondary | ICD-10-CM | POA: Diagnosis not present

## 2022-11-27 DIAGNOSIS — F1020 Alcohol dependence, uncomplicated: Secondary | ICD-10-CM | POA: Diagnosis not present

## 2022-11-30 DIAGNOSIS — F1020 Alcohol dependence, uncomplicated: Secondary | ICD-10-CM | POA: Diagnosis not present

## 2022-12-02 DIAGNOSIS — F1020 Alcohol dependence, uncomplicated: Secondary | ICD-10-CM | POA: Diagnosis not present

## 2022-12-05 DIAGNOSIS — F1020 Alcohol dependence, uncomplicated: Secondary | ICD-10-CM | POA: Diagnosis not present

## 2022-12-08 DIAGNOSIS — F1020 Alcohol dependence, uncomplicated: Secondary | ICD-10-CM | POA: Diagnosis not present

## 2022-12-11 DIAGNOSIS — F1020 Alcohol dependence, uncomplicated: Secondary | ICD-10-CM | POA: Diagnosis not present

## 2023-03-31 DIAGNOSIS — M7989 Other specified soft tissue disorders: Secondary | ICD-10-CM | POA: Diagnosis not present

## 2023-03-31 DIAGNOSIS — M25462 Effusion, left knee: Secondary | ICD-10-CM | POA: Diagnosis not present

## 2023-03-31 DIAGNOSIS — R200 Anesthesia of skin: Secondary | ICD-10-CM | POA: Diagnosis not present

## 2023-03-31 DIAGNOSIS — M25562 Pain in left knee: Secondary | ICD-10-CM | POA: Diagnosis not present

## 2023-03-31 DIAGNOSIS — M7052 Other bursitis of knee, left knee: Secondary | ICD-10-CM | POA: Diagnosis not present

## 2023-03-31 DIAGNOSIS — T1490XA Injury, unspecified, initial encounter: Secondary | ICD-10-CM | POA: Diagnosis not present

## 2023-06-26 ENCOUNTER — Other Ambulatory Visit: Payer: Self-pay

## 2023-06-26 ENCOUNTER — Encounter (HOSPITAL_COMMUNITY): Payer: Self-pay | Admitting: Emergency Medicine

## 2023-06-26 ENCOUNTER — Emergency Department (HOSPITAL_COMMUNITY)
Admission: EM | Admit: 2023-06-26 | Discharge: 2023-06-26 | Disposition: A | Discharge status: Home / Self Care | Payer: MEDICAID | Attending: Emergency Medicine | Admitting: Emergency Medicine

## 2023-06-26 DIAGNOSIS — R55 Syncope and collapse: Secondary | ICD-10-CM | POA: Insufficient documentation

## 2023-06-26 LAB — CBC
HCT: 46.7 % (ref 39.0–52.0)
Hemoglobin: 15 g/dL (ref 13.0–17.0)
MCH: 28.9 pg (ref 26.0–34.0)
MCHC: 32.1 g/dL (ref 30.0–36.0)
MCV: 90 fL (ref 80.0–100.0)
Platelets: 148 10*3/uL — ABNORMAL LOW (ref 150–400)
RBC: 5.19 MIL/uL (ref 4.22–5.81)
RDW: 14.8 % (ref 11.5–15.5)
WBC: 6.4 10*3/uL (ref 4.0–10.5)
nRBC: 0 % (ref 0.0–0.2)

## 2023-06-26 LAB — URINALYSIS, ROUTINE W REFLEX MICROSCOPIC
Bilirubin Urine: NEGATIVE
Glucose, UA: NEGATIVE mg/dL
Hgb urine dipstick: NEGATIVE
Ketones, ur: NEGATIVE mg/dL
Leukocytes,Ua: NEGATIVE
Nitrite: NEGATIVE
Protein, ur: NEGATIVE mg/dL
Specific Gravity, Urine: 1.005 (ref 1.005–1.030)
pH: 7 (ref 5.0–8.0)

## 2023-06-26 LAB — BASIC METABOLIC PANEL
Anion gap: 8 (ref 5–15)
BUN: 12 mg/dL (ref 6–20)
CO2: 23 mmol/L (ref 22–32)
Calcium: 7.8 mg/dL — ABNORMAL LOW (ref 8.9–10.3)
Chloride: 106 mmol/L (ref 98–111)
Creatinine, Ser: 0.9 mg/dL (ref 0.61–1.24)
GFR, Estimated: >60 mL/min (ref >60)
Glucose, Bld: 109 mg/dL — ABNORMAL HIGH (ref 70–99)
Potassium: 3.4 mmol/L — ABNORMAL LOW (ref 3.5–5.1)
Sodium: 137 mmol/L (ref 135–145)

## 2023-06-26 LAB — CBG MONITORING, ED: Glucose-Capillary: 96 mg/dL (ref 70–99)

## 2023-06-26 NOTE — ED Provider Triage Note (Signed)
 Emergency Medicine Provider Triage Evaluation Note  HOBSON LAX , a 58 y.o. male  was evaluated in triage.  Pt complains of syncopal episode after plasmapheresis today  Review of Systems  Positive: Syncope, mild dizziness Negative: Chest pain, shortness of breath  Physical Exam  BP 110/63 (BP Location: Left Arm)   Pulse 60   Temp 97.6 F (36.4 C) (Oral)   Resp 15   SpO2 100%  Gen:   Awake, no distress   Resp:  Normal effort  MSK:   Moves extremities without difficulty  Other:    Medical Decision Making  Medically screening exam initiated at 8:02 PM.  Appropriate orders placed.  KEVYN BOQUET was informed that the remainder of the evaluation will be completed by another provider, this initial triage assessment does not replace that evaluation, and the importance of remaining in the ED until their evaluation is complete.     Felicie Morn, NP 06/26/23 2008
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
## 2023-06-26 NOTE — ED Provider Notes (Signed)
 [place] EMERGENCY DEPARTMENT AT [facility] Provider Note   CSN: [id number] Arrival date & time: 06/26/23  1941     History  Chief Complaint  Patient presents with   Hypotension    Zachary Kemp is a 58 y.o. male.  HPI   Patient presented to the ED for evaluation of a syncopal episode during plasma donation.  Patient states he was donating plasma and the machine may have malfunctioned it took 800 cc of plasma and 200 cc of RBCs.  Patient was noted to have syncopal episode at the center.  EMS was called.  Patient is initially hypotensive reportedly.  He was given 500 cc of IV fluids and oral rehydration.  Patient's symptoms have resolved.  He is not have any chest pain no abdominal pain.  Home Medications Prior to Admission medications   Medication Sig Start Date End Date Taking? Authorizing Provider  famotidine (PEPCID) 20 MG tablet Take 1 tablet (20 mg total) by mouth daily. 08/26/22 08/26/23  Phineas Semen, MD  methocarbamol (ROBAXIN) 500 MG tablet Take 1 tablet (500 mg total) by mouth 4 (four) times daily. Patient not taking: Reported on 09/03/2020 01/24/19   Evon Slack, PA-C  sucralfate (CARAFATE) 1 g tablet Take 1 tablet (1 g total) by mouth 4 (four) times daily. 08/26/22   Phineas Semen, MD      Allergies    Patient has no known allergies.    Review of Systems   Review of Systems  Physical Exam Updated Vital Signs BP 115/72 (BP Location: Left Arm)   Pulse 66   Temp 97.7 F (36.5 C) (Oral)   Resp 18   SpO2 98%  Physical Exam Vitals and nursing note reviewed.  Constitutional:      General: He is not in acute distress.    Appearance: He is well-developed.  HENT:     Head: Normocephalic and atraumatic.     Right Ear: External ear normal.     Left Ear: External ear normal.  Eyes:     General: No scleral icterus.       Right eye: No discharge.        Left eye: No discharge.     Conjunctiva/sclera: Conjunctivae normal.  Neck:      Trachea: No tracheal deviation.  Cardiovascular:     Rate and Rhythm: Normal rate and regular rhythm.  Pulmonary:     Effort: Pulmonary effort is normal. No respiratory distress.     Breath sounds: Normal breath sounds. No stridor. No wheezing or rales.  Abdominal:     General: Bowel sounds are normal. There is no distension.     Palpations: Abdomen is soft.     Tenderness: There is no abdominal tenderness. There is no guarding or rebound.  Musculoskeletal:        General: No tenderness or deformity.     Cervical back: Neck supple.  Skin:    General: Skin is warm and dry.     Findings: No rash.  Neurological:     General: No focal deficit present.     Mental Status: He is alert.     Cranial Nerves: No cranial nerve deficit, dysarthria or facial asymmetry.     Sensory: No sensory deficit.     Motor: No abnormal muscle tone or seizure activity.     Coordination: Coordination normal.  Psychiatric:        Mood and Affect: Mood normal.     ED Results / Procedures /  Treatments   Labs (all labs ordered are listed, but only abnormal results are displayed) Labs Reviewed  BASIC METABOLIC PANEL - Abnormal; Notable for the following components:      Result Value   Potassium 3.4 (*)    Glucose, Bld 109 (*)    Calcium 7.8 (*)    All other components within normal limits  CBC - Abnormal; Notable for the following components:   Platelets 148 (*)    All other components within normal limits  URINALYSIS, ROUTINE W REFLEX MICROSCOPIC - Abnormal; Notable for the following components:   Color, Urine STRAW (*)    All other components within normal limits  CBG MONITORING, ED    EKG EKG Interpretation Date/Time:  Tuesday June 26 2023 20:12:17 EDT Ventricular Rate:  56 PR Interval:  155 QRS Duration:  91 QT Interval:  408 QTC Calculation: 394 R Axis:   54  Text Interpretation: Sinus rhythm Borderline T abnormalities, anterior leads new since last tracing Confirmed by Linwood Dibbles  [phone]) on 06/26/2023 8:33:57 PM  Radiology No results found.  Procedures Procedures    Medications Ordered in ED Medications - No data to display  ED Course/ Medical Decision Making/ A&P Clinical Course as of 06/26/23 2240  Tue Jun 26, 2023  2239 Urinalysis, Routine w reflex microscopic -Urine, Clean Catch(!) Normal [JK]  2239 Basic metabolic panel(!) No significant abnormalities [JK]  2239 CBC(!) Normal [JK]    Clinical Course User Index [JK] Linwood Dibbles, MD                                 Medical Decision Making Amount and/or Complexity of Data Reviewed Labs: ordered.   Patient's ED workup is reassuring.  No signs of severe dehydration.  He is not anemic.  No cardiac dysrhythmia noted on EKG patient's vitals have percent range normal.  Patient is feeling well now.  He was able to walk without any recurrent lightheadedness or discomfort.  Patient has been given something to eat.  Patient syncopal episode [place] related to the plasma donation.  Evaluation and diagnostic testing in the emergency department does not suggest an emergent condition requiring admission or immediate intervention beyond what has been performed at this time.  The patient is safe for discharge and has been instructed to return immediately for worsening symptoms, change in symptoms or any other concerns.        Final Clinical Impression(s) / ED Diagnoses Final diagnoses:  Syncope, unspecified syncope type    Rx / DC Orders ED Discharge Orders     None         Linwood Dibbles, MD 06/26/23 2240

## 2023-06-26 NOTE — Discharge Instructions (Signed)
 Continue to hydrate over the next 24 hours her symptoms should not return.  Return to the ER for recurrent symptoms fevers chills or other concerning symptoms

## 2023-09-19 ENCOUNTER — Emergency Department: Payer: MEDICAID

## 2023-09-19 ENCOUNTER — Other Ambulatory Visit: Payer: Self-pay

## 2023-09-19 ENCOUNTER — Emergency Department
Admission: EM | Admit: 2023-09-19 | Discharge: 2023-09-19 | Disposition: A | Discharge status: Home / Self Care | Payer: MEDICAID | Attending: Emergency Medicine | Admitting: Emergency Medicine

## 2023-09-19 DIAGNOSIS — M542 Cervicalgia: Secondary | ICD-10-CM | POA: Diagnosis present

## 2023-09-19 DIAGNOSIS — Y92410 Unspecified street and highway as the place of occurrence of the external cause: Secondary | ICD-10-CM | POA: Diagnosis not present

## 2023-09-19 DIAGNOSIS — S161XXA Strain of muscle, fascia and tendon at neck level, initial encounter: Secondary | ICD-10-CM | POA: Insufficient documentation

## 2023-09-19 NOTE — Discharge Instructions (Signed)
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
